# Patient Record
Sex: Female | Born: 1995 | Race: White | Hispanic: No | Marital: Single | Smoking: Never smoker
Health system: Southern US, Community
[De-identification: ages and names within clinical notes are randomized; demographics above are authoritative.]

---

## 2015-05-12 ENCOUNTER — Ambulatory Visit
Admission: EM | Admit: 2015-05-12 | Discharge: 2015-05-12 | Disposition: A | Discharge status: Home / Self Care | Payer: Managed Care, Other (non HMO) | Attending: Family Medicine | Admitting: Family Medicine

## 2015-05-12 ENCOUNTER — Encounter: Payer: Self-pay | Admitting: Emergency Medicine

## 2015-05-12 DIAGNOSIS — J019 Acute sinusitis, unspecified: Secondary | ICD-10-CM

## 2015-05-12 MED ORDER — FEXOFENADINE-PSEUDOEPHED ER 180-240 MG PO TB24: 1.0000 | ORAL_TABLET | Freq: Every day | ORAL | Status: AC

## 2015-05-12 MED ORDER — FLUTICASONE PROPIONATE 50 MCG/ACT NA SUSP: 2.0000 | Freq: Every day | NASAL | Status: AC

## 2015-05-12 MED ORDER — CEFUROXIME AXETIL 500 MG PO TABS: 500.0000 mg | ORAL_TABLET | Freq: Two times a day (BID) | ORAL | Status: DC

## 2015-05-12 NOTE — Discharge Instructions (Signed)

## 2015-05-12 NOTE — ED Provider Notes (Signed)
CSN: [id number]     Arrival date & time 05/12/15  [id number] History   First MD Initiated Contact with Patient 05/12/15 [phone]     Chief Complaint  Patient presents with  . Cough  . Nasal Congestion   (Consider location/radiation/quality/duration/timing/severity/associated sxs/prior Treatment) Patient is a 19 y.o. female presenting with cough. The history is provided by the patient and a parent. No language interpreter was used.  Cough Cough characteristics:  Productive Sputum characteristics:  Green and gray Severity:  Moderate Onset quality:  Sudden (Started about 11 days ago last Monday.) Progression:  Waxing and waning Chronicity:  New Context: upper respiratory infection   Context: not animal exposure, not exposure to allergens, not fumes, not occupational exposure, not sick contacts, not smoke exposure, not weather changes and not with activity   Relieved by:  Nothing Associated symptoms: fever, headaches, myalgias, rash, rhinorrhea and sinus congestion   Associated symptoms: no diaphoresis, no ear fullness, no ear pain, no shortness of breath, no sore throat, no weight loss and no wheezing     History reviewed. No pertinent past medical history. History reviewed. No pertinent past surgical history. History reviewed. No pertinent family history. History  Substance Use Topics  . Smoking status: Never Smoker   . Smokeless tobacco: Never Used  . Alcohol Use: No   OB History    No data available     Review of Systems  Constitutional: Positive for fever. Negative for weight loss and diaphoresis.  HENT: Positive for rhinorrhea. Negative for ear pain and sore throat.   Respiratory: Positive for cough. Negative for shortness of breath and wheezing.   Musculoskeletal: Positive for myalgias.  Skin: Positive for rash.  Neurological: Positive for headaches.    Allergies  Review of patient's allergies indicates no known allergies.  Home Medications   Prior to Admission medications    Medication Sig Start Date End Date Taking? Authorizing Provider  levonorgestrel (MIRENA) 20 MCG/24HR IUD 1 each by Intrauterine route once.   Yes Historical Provider, MD  cefUROXime (CEFTIN) 500 MG tablet Take 1 tablet (500 mg total) by mouth 2 (two) times daily. 05/12/15   Hassan RowanEugene Linah Klapper, MD  fexofenadine-pseudoephedrine (ALLEGRA-D ALLERGY & CONGESTION) 180-240 MG per 24 hr tablet Take 1 tablet by mouth daily. 05/12/15   Hassan RowanEugene Dominic Rhome, MD  fluticasone (FLONASE) 50 MCG/ACT nasal spray Place 2 sprays into both nostrils daily. 05/12/15   Hassan RowanEugene Gari Hartsell, MD   BP 132/85 mmHg  Pulse 84  Temp(Src) 97.4 F (36.3 C) (Tympanic)  Resp 16  Ht 5\' 3"  (1.6 m)  Wt 140 lb (63.504 kg)  BMI 24.81 kg/m2  SpO2 100%  LMP  Physical Exam  Constitutional: She is oriented to person, place, and time. She appears well-developed and well-nourished. No distress.  HENT:  Head: Normocephalic and atraumatic.  Nose: Mucosal edema and rhinorrhea present.  Mouth/Throat: She does not have dentures. No oral lesions. No dental abscesses or dental caries. Posterior oropharyngeal erythema present.  Eyes: Pupils are equal, round, and reactive to light. Right eye exhibits no discharge. Left eye exhibits no discharge. No scleral icterus.  Neck: Neck supple. No tracheal deviation present.  Cardiovascular: Normal rate and regular rhythm.  Exam reveals no friction rub.   No murmur heard. Pulmonary/Chest: Effort normal and breath sounds normal. No respiratory distress.  Musculoskeletal: Normal range of motion. She exhibits no edema.  Lymphadenopathy:    She has cervical adenopathy.  Neurological: She is oriented to person, place, and time.  Skin: Skin is dry.  ED Course  Procedures (including critical care time) Labs Review Labs Reviewed - No data to display Patient was treated with Ceftin Allegra-D and Flonase spray. Imaging Review No results found.   MDM   1. Acute sinusitis, recurrence not specified, unspecified location         Hassan RowanEugene Yehoshua Vitelli, MD 05/12/15 [id number]

## 2015-05-12 NOTE — ED Notes (Signed)
Patient c/o cough, chest congestion, runny nose, HAs, and low grade fever for a week.

## 2017-12-28 ENCOUNTER — Other Ambulatory Visit: Payer: Self-pay

## 2017-12-28 ENCOUNTER — Encounter: Payer: Self-pay | Admitting: Emergency Medicine

## 2017-12-28 ENCOUNTER — Emergency Department
Admission: EM | Admit: 2017-12-28 | Discharge: 2017-12-28 | Disposition: A | Discharge status: Home / Self Care | Payer: 59 | Attending: Emergency Medicine | Admitting: Emergency Medicine

## 2017-12-28 ENCOUNTER — Emergency Department: Payer: 59

## 2017-12-28 DIAGNOSIS — Z79899 Other long term (current) drug therapy: Secondary | ICD-10-CM | POA: Insufficient documentation

## 2017-12-28 DIAGNOSIS — R000 Tachycardia, unspecified: Secondary | ICD-10-CM | POA: Insufficient documentation

## 2017-12-28 DIAGNOSIS — E039 Hypothyroidism, unspecified: Secondary | ICD-10-CM

## 2017-12-28 LAB — COMPREHENSIVE METABOLIC PANEL
ALBUMIN: 4.9 g/dL (ref 3.5–5.0)
ALT: 16 U/L (ref 14–54)
AST: 31 U/L (ref 15–41)
Alkaline Phosphatase: 66 U/L (ref 38–126)
Anion gap: 9 (ref 5–15)
BUN: 15 mg/dL (ref 6–20)
CHLORIDE: 105 mmol/L (ref 101–111)
CO2: 26 mmol/L (ref 22–32)
CREATININE: 0.87 mg/dL (ref 0.44–1.00)
Calcium: 9.5 mg/dL (ref 8.9–10.3)
GFR calc Af Amer: >60 mL/min (ref >60)
GFR calc non Af Amer: >60 mL/min (ref >60)
Glucose, Bld: 102 mg/dL — ABNORMAL HIGH (ref 65–99)
Potassium: 4 mmol/L (ref 3.5–5.1)
SODIUM: 140 mmol/L (ref 135–145)
Total Bilirubin: 0.7 mg/dL (ref 0.3–1.2)
Total Protein: 8.6 g/dL — ABNORMAL HIGH (ref 6.5–8.1)

## 2017-12-28 LAB — CBC WITH DIFFERENTIAL/PLATELET
Basophils Absolute: 0 10*3/uL (ref 0–0.1)
Basophils Relative: 1 %
EOS ABS: 0.1 10*3/uL (ref 0–0.7)
EOS PCT: 1 %
HCT: 41.3 % (ref 35.0–47.0)
Hemoglobin: 14.2 g/dL (ref 12.0–16.0)
LYMPHS ABS: 0.9 10*3/uL — AB (ref 1.0–3.6)
LYMPHS PCT: 14 %
MCH: 30.1 pg (ref 26.0–34.0)
MCHC: 34.3 g/dL (ref 32.0–36.0)
MCV: 87.6 fL (ref 80.0–100.0)
MONOS PCT: 8 %
Monocytes Absolute: 0.5 10*3/uL (ref 0.2–0.9)
Neutro Abs: 4.9 10*3/uL (ref 1.4–6.5)
Neutrophils Relative %: 76 %
Platelets: 288 10*3/uL (ref 150–440)
RBC: 4.71 MIL/uL (ref 3.80–5.20)
RDW: 13.3 % (ref 11.5–14.5)
WBC: 6.4 10*3/uL (ref 3.6–11.0)

## 2017-12-28 LAB — URINALYSIS, COMPLETE (UACMP) WITH MICROSCOPIC
BACTERIA UA: NONE SEEN
Bilirubin Urine: NEGATIVE
Glucose, UA: NEGATIVE mg/dL
Hgb urine dipstick: NEGATIVE
Ketones, ur: NEGATIVE mg/dL
Nitrite: NEGATIVE
PH: 5 (ref 5.0–8.0)
PROTEIN: NEGATIVE mg/dL
Specific Gravity, Urine: 1.006 (ref 1.005–1.030)

## 2017-12-28 LAB — URINE DRUG SCREEN, QUALITATIVE (ARMC ONLY)
AMPHETAMINES, UR SCREEN: NOT DETECTED
BARBITURATES, UR SCREEN: NOT DETECTED
Benzodiazepine, Ur Scrn: NOT DETECTED
Cannabinoid 50 Ng, Ur ~~LOC~~: NOT DETECTED
Cocaine Metabolite,Ur ~~LOC~~: NOT DETECTED
MDMA (Ecstasy)Ur Screen: NOT DETECTED
METHADONE SCREEN, URINE: NOT DETECTED
OPIATE, UR SCREEN: NOT DETECTED
Phencyclidine (PCP) Ur S: NOT DETECTED
TRICYCLIC, UR SCREEN: POSITIVE — AB

## 2017-12-28 LAB — TSH: TSH: 16.677 u[IU]/mL — AB (ref 0.350–4.500)

## 2017-12-28 LAB — HCG, QUANTITATIVE, PREGNANCY

## 2017-12-28 LAB — POCT PREGNANCY, URINE: PREG TEST UR: NEGATIVE

## 2017-12-28 LAB — ETHANOL

## 2017-12-28 MED ORDER — METOPROLOL TARTRATE 50 MG PO TABS: 50.0000 mg | ORAL_TABLET | Freq: Two times a day (BID) | ORAL | 0 refills | Status: AC

## 2017-12-28 MED ORDER — SODIUM CHLORIDE 0.9 % IV BOLUS (SEPSIS)
1000.0000 mL | Freq: Once | INTRAVENOUS | Status: AC
  Administered 2017-12-28: 1000 mL via INTRAVENOUS

## 2017-12-28 MED ORDER — METOPROLOL TARTRATE 50 MG PO TABS
50.0000 mg | ORAL_TABLET | Freq: Once | ORAL | Status: AC
  Administered 2017-12-28: 50 mg via ORAL
  Filled 2017-12-28: qty 1

## 2017-12-28 NOTE — ED Provider Notes (Addendum)
[facility] Emergency Department Provider Note  ____________________________________________   First MD Initiated Contact with Patient 12/28/17 [phone]     (approximate)  I have reviewed the triage vital signs and the nursing notes.   HISTORY  Chief Complaint Tachycardia   HPI Karen Woodard is a 22 y.o. female with a history of headache ongoing over the past 2 months was presenting to the emergency department today with palpitations.  Says that the palpitations worker at 5:45 AM.  Says that she feels that her heart is fluttering but denies any chest pain, shortness of breath or nausea.  She does not report that she is feeling like she is going to pass out.  She says that she is started nortriptyline as of late December and is increased her dose to 20 mg.  Nortriptyline has not helped the headaches.  She was supposed to start a Medrol Dosepak today but has not started yet.  She says that she has had a CAT scan which came back without any acute disease this past October when the headache started.  She is discussing a possible MRI with her neurologist, Dr. Malvin Johns, at the [facility].  No family history of cardiac arrhythmia.  Mother has a history of headaches and also a cerebral venous sinus thrombosis that she developed after giving birth.  Patient is denying headache at this time.  Patient said that she had one beer last night but does not drink heavily.  Denies any drug use.   History reviewed. No pertinent past medical history.  There are no active problems to display for this patient.   History reviewed. No pertinent surgical history.  Prior to Admission medications   Medication Sig Start Date End Date Taking? Authorizing Provider  cefUROXime (CEFTIN) 500 MG tablet Take 1 tablet (500 mg total) by mouth 2 (two) times daily. 05/12/15   Hassan Rowan, MD  fexofenadine-pseudoephedrine (ALLEGRA-D ALLERGY & CONGESTION) 180-240 MG per 24 hr tablet Take 1 tablet by  mouth daily. 05/12/15   Hassan Rowan, MD  fluticasone (FLONASE) 50 MCG/ACT nasal spray Place 2 sprays into both nostrils daily. 05/12/15   Hassan Rowan, MD  levonorgestrel (MIRENA) 20 MCG/24HR IUD 1 each by Intrauterine route once.    [provider]    Allergies Patient has no known allergies.  No family history on file.  Social History Social History   Tobacco Use  . Smoking status: Never Smoker  . Smokeless tobacco: Never Used  Substance Use Topics  . Alcohol use: Yes    Comment: occ  . Drug use: No    Review of Systems  Constitutional: No fever/chills Eyes: No visual changes. ENT: No sore throat. Cardiovascular: As above Respiratory: Denies shortness of breath. Gastrointestinal: No abdominal pain.  No nausea, no vomiting.  No diarrhea.  No constipation. Genitourinary: Negative for dysuria. Musculoskeletal: Negative for back pain. Skin: Negative for rash. Neurological: Negative for focal weakness or numbness.   ____________________________________________   PHYSICAL EXAM:  VITAL SIGNS: ED Triage Vitals  Enc Vitals Group     BP 12/28/17 0636 (!) 150/85     Pulse Rate 12/28/17 0636 (!) 148     Resp 12/28/17 0636 18     Temp 12/28/17 0636 98.4 F (36.9 C)     Temp Source 12/28/17 0636 Oral     SpO2 12/28/17 0636 100 %     Weight 12/28/17 0633 148 lb (67.1 kg)     Height 12/28/17 0633 5\' 4"  (1.626 m)  Head Circumference --      Peak Flow --      Pain Score --      Pain Loc --      Pain Edu? --      Excl. in GC? --     Constitutional: Alert and oriented. Well appearing and in no acute distress. Eyes: Conjunctivae are normal.  Head: Atraumatic. Nose: No congestion/rhinnorhea. Mouth/Throat: Mucous membranes are moist.  Neck: No stridor.   Cardiovascular: Tachycardic, regular rhythm. Grossly normal heart sounds.   Respiratory: Normal respiratory effort.  No retractions. Lungs CTAB. Gastrointestinal: Soft and nontender. No distention. No CVA  tenderness. Musculoskeletal: No lower extremity tenderness nor edema.  No joint effusions. Neurologic:  Normal speech and language. No gross focal neurologic deficits are appreciated. Skin:  Skin is warm, dry and intact. No rash noted. Psychiatric: Mood and affect are normal. Speech and behavior are normal.  ____________________________________________   LABS (all labs ordered are listed, but only abnormal results are displayed)  Labs Reviewed  CBC WITH DIFFERENTIAL/PLATELET - Abnormal; Notable for the following components:      Result Value   Lymphs Abs 0.9 (*)    All other components within normal limits  URINALYSIS, COMPLETE (UACMP) WITH MICROSCOPIC - Abnormal; Notable for the following components:   Color, Urine STRAW (*)    APPearance CLEAR (*)    Leukocytes, UA MODERATE (*)    Squamous Epithelial / LPF 0-5 (*)    All other components within normal limits  URINE DRUG SCREEN, QUALITATIVE ([facility] ONLY) - Abnormal; Notable for the following components:   Tricyclic, Ur Screen POSITIVE (*)    All other components within normal limits  ETHANOL  COMPREHENSIVE METABOLIC PANEL  TSH  HCG, QUANTITATIVE, PREGNANCY  POCT PREGNANCY, URINE   ____________________________________________  EKG  ED ECG REPORT I, Arelia Longestavid M Schaevitz, the attending physician, personally viewed and interpreted this ECG.   Date: 12/28/2017  EKG Time: 0633  Rate: 148  Rhythm: sinus tachycardia  Axis: Normal  Intervals:none  ST&T Change: No ST segment elevation or depression.  No abnormal T wave inversion.  ____________________________________________  RADIOLOGY  Chest x-ray without active disease ____________________________________________   PROCEDURES  Procedure(s) performed:   Procedures  Critical Care performed:   ____________________________________________   INITIAL IMPRESSION / ASSESSMENT AND PLAN / ED COURSE  Pertinent labs & imaging results that were available during my care of  the patient were reviewed by me and considered in my medical decision making (see chart for details).  DDX: Nortriptyline side effect, atrial flutter, sinus tachycardia, hyperthyroidism As part of my medical decision making, I reviewed the following data within the electronic MEDICAL RECORD NUMBER Old chart reviewed  ----------------------------------------- 7:53 AM on 12/28/2017 -----------------------------------------  I discussed the case with Dr. Darrold JunkerParaschos who believes that this is likely a sinus tachycardia.  Recommends weaning the patient off the nortriptyline over the course of 1 week as well as starting twice daily metoprolol at 50 mg.  Recommend that the patient follow-up in the office within 1 week.  Consider the diagnosis of pulmonary embolus but there is no chest pain or shortness of breath.  Less likely to be PE.   ----------------------------------------- 8:54 AM on 12/28/2017 -----------------------------------------  Patient's heart rate of 103 at this time.  Still with blood pressure in the 140s.  I reviewed her blood pressures over the past several months and they appear to be in the 120s-130s.  We will proceed with 50 mg of twice daily metoprolol as recommended  by Dr. Darrold Junker.  Explained the plan to the patient as well as family including weaning off the nortriptyline over the next week.  I told the patient that she may use her Medrol Dosepak as needed but to stop it if she experiences side effects from the steroids.  She also knows that she was follow-up with cardiology in the office, to stay hydrated and also to reduce any caffeine intake. Patient with elevated TSH but this would most likely cause a low heart rate or fatigue.  Unclear clinical significance as this is contributing to the patient's current medical state.  However, we reviewed the lab results and she will be following with her primary care doctor.   ____________________________________________   FINAL CLINICAL  IMPRESSION(S) / ED DIAGNOSES  Sinus tachycardia.    NEW MEDICATIONS STARTED DURING THIS VISIT:  This SmartLink is deprecated. Use AVSMEDLIST instead to display the medication list for a patient.   Note:  This document was prepared using Dragon voice recognition software and may include unintentional dictation errors.     Myrna Blazer, MD 12/28/17 [phone]    Myrna Blazer, MD 12/28/17 [phone]

## 2017-12-28 NOTE — ED Notes (Signed)

## 2017-12-28 NOTE — ED Notes (Addendum)
Pt reports she was awakened around 545 am with th feeling of her heart racing. Pt denies chest pain, shortness of breath or other sx. Pt denies hx of same.

## 2017-12-28 NOTE — ED Triage Notes (Signed)
Heart racing since 5:45 am

## 2017-12-31 ENCOUNTER — Other Ambulatory Visit: Payer: Self-pay | Admitting: Neurology

## 2017-12-31 DIAGNOSIS — R519 Headache, unspecified: Secondary | ICD-10-CM

## 2017-12-31 DIAGNOSIS — R51 Headache: Principal | ICD-10-CM

## 2018-01-07 ENCOUNTER — Ambulatory Visit: Payer: 59

## 2018-01-08 ENCOUNTER — Ambulatory Visit
Admission: RE | Admit: 2018-01-08 | Discharge: 2018-01-08 | Disposition: A | Discharge status: Home / Self Care | Payer: 59 | Source: Ambulatory Visit | Attending: Neurology | Admitting: Neurology

## 2018-01-08 DIAGNOSIS — R519 Headache, unspecified: Secondary | ICD-10-CM

## 2018-01-08 DIAGNOSIS — R51 Headache: Secondary | ICD-10-CM | POA: Insufficient documentation

## 2018-01-08 DIAGNOSIS — R42 Dizziness and giddiness: Secondary | ICD-10-CM | POA: Diagnosis present

## 2018-04-30 IMAGING — MR MR HEAD W/O CM
8 series · 48 of 48 positions shown · non-contrast
Comparison: None.

CLINICAL DATA: Chronic daily headache

EXAM:
MRI HEAD WITHOUT CONTRAST
TECHNIQUE: Multiplanar, multiecho pulse sequences of the brain and surrounding
structures were obtained without intravenous contrast.

[Series 2: T1 · sagittal · 5.0mm · 0.45mm/px · 3 of 23 slices shown (1 of 2)]
[im 1/23]
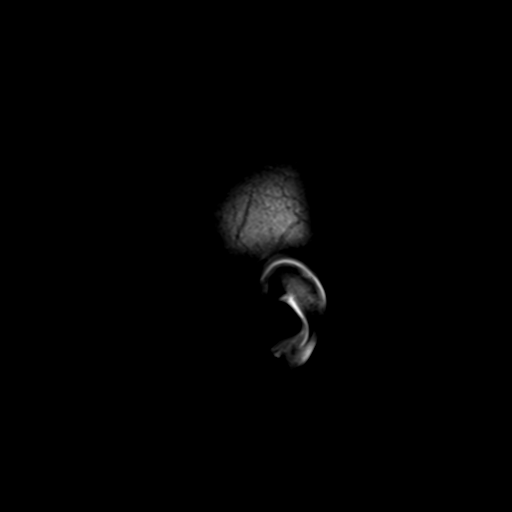
[im 12/23]
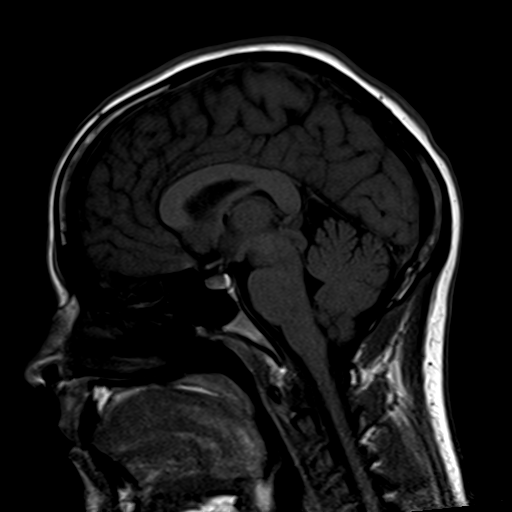
[im 23/23]
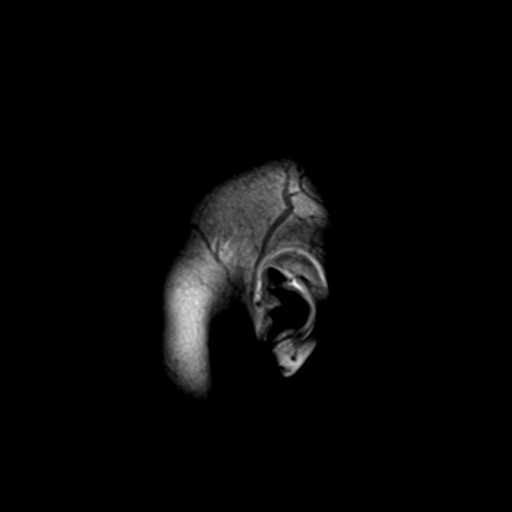

[Series 4: DWI · axial · 3.0mm · 1.20mm/px · z∈[-57,+104]mm · 6 of 55 slices shown (1 of 2)]
[im 1/55]
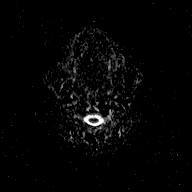
[im 11/55]
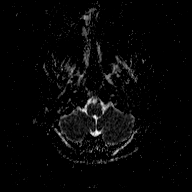
[im 22/55]
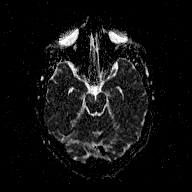
[im 33/55]
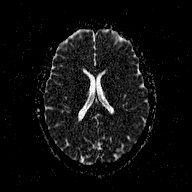
[im 44/55]
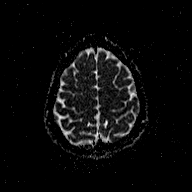
[im 55/55]
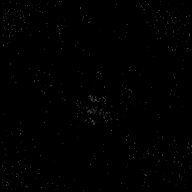

[Series 6: FLAIR · axial · 3.0mm · 0.45mm/px · z∈[-58,+103]mm · 6 of 55 slices shown]
[im 1/55]
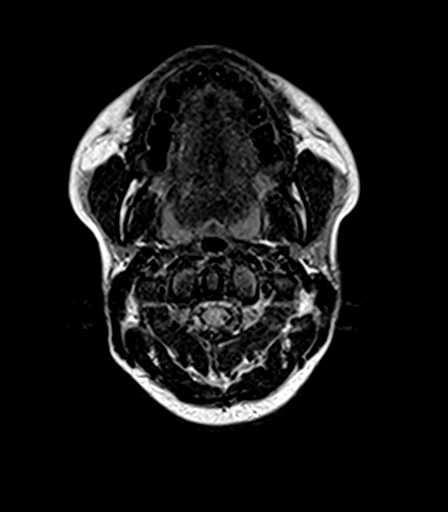
[im 11/55]
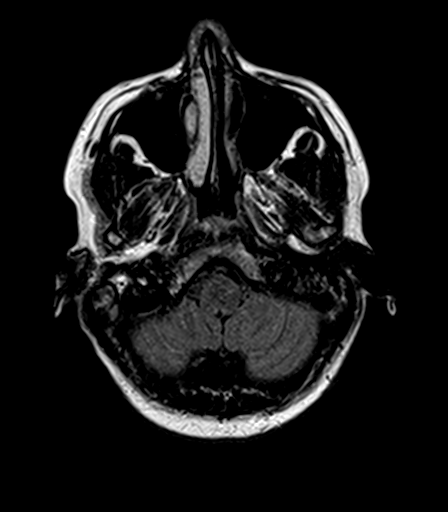
[im 22/55]
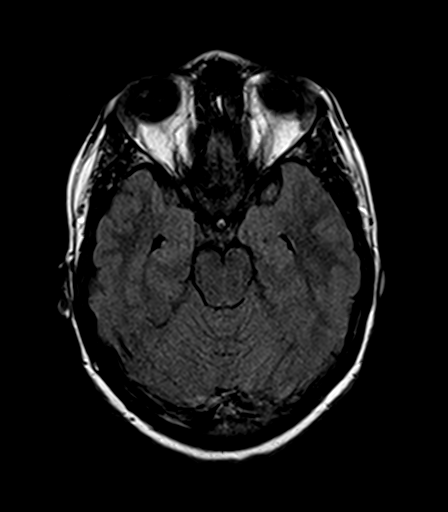
[im 33/55]
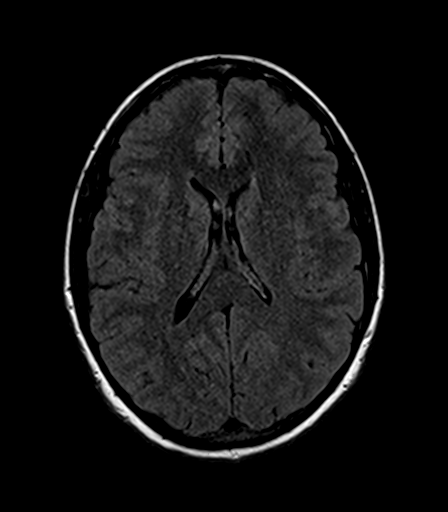
[im 44/55]
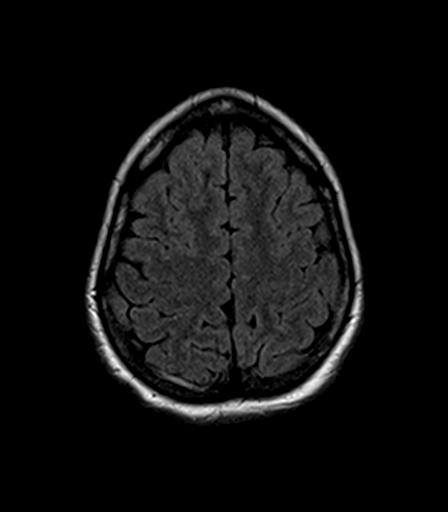
[im 55/55]
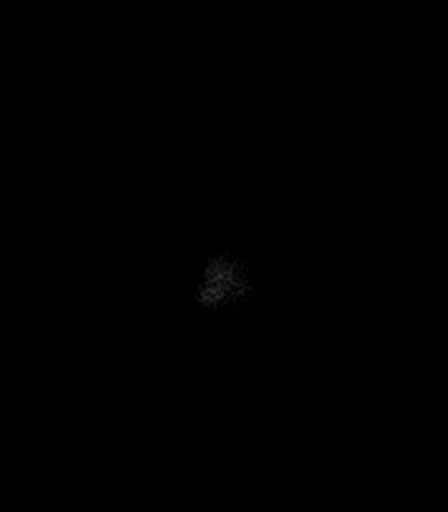

[Series 7: T2 · axial · 5.0mm · 0.72mm/px · z∈[-54,+99]mm · 3 of 23 slices shown (1 of 3)]
[im 1/23]
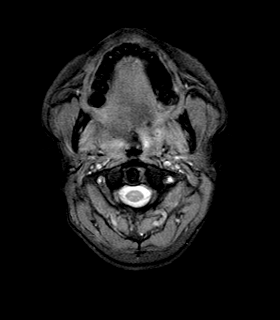
[im 12/23]
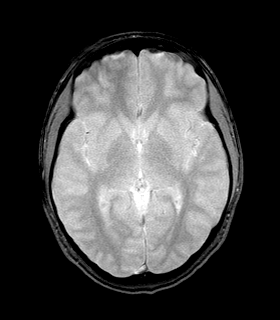
[im 23/23]
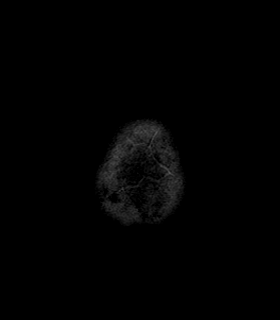

[Series 8: T1 · axial · 1.0mm · 1.00mm/px · z∈[-58,+100]mm · 18 of 160 slices shown (2 of 2)]
[im 1/160]
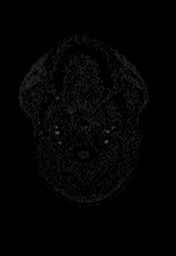
[im 10/160]
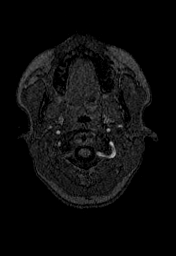
[im 19/160]
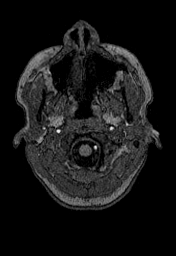
[im 29/160]
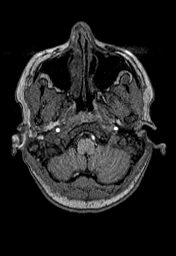
[im 38/160]
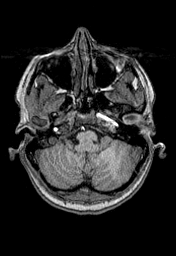
[im 47/160]
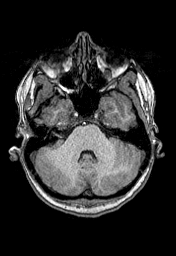
[im 57/160]
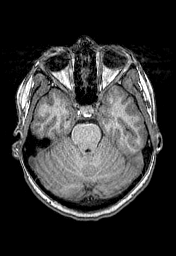
[im 66/160]
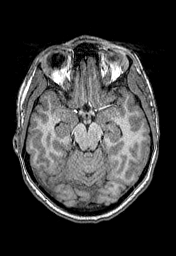
[im 75/160]
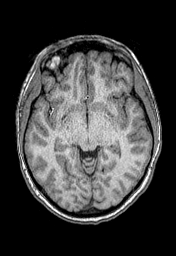
[im 85/160]
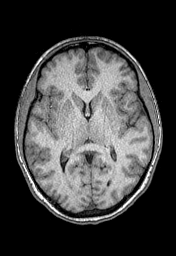
[im 94/160]
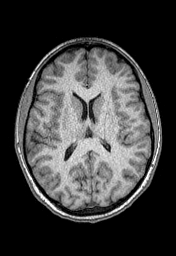
[im 103/160]
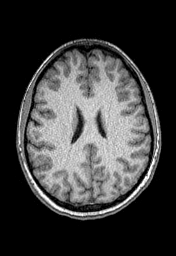
[im 113/160]
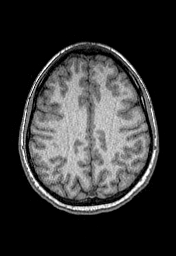
[im 122/160]
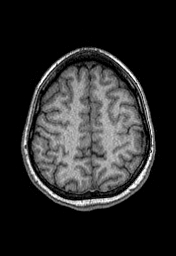
[im 131/160]
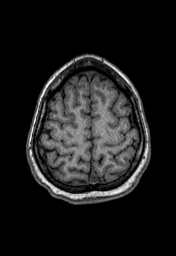
[im 141/160]
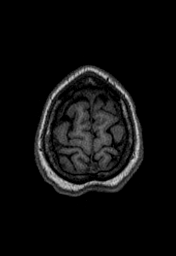
[im 150/160]
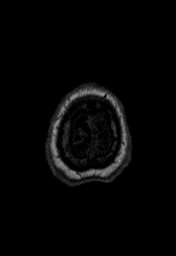
[im 160/160]
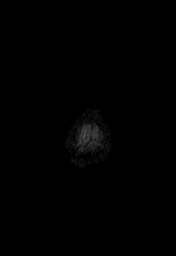

[Series 9: T2 · coronal · 5.0mm · 0.45mm/px · 3 of 29 slices shown (2 of 3)]
[im 1/29]
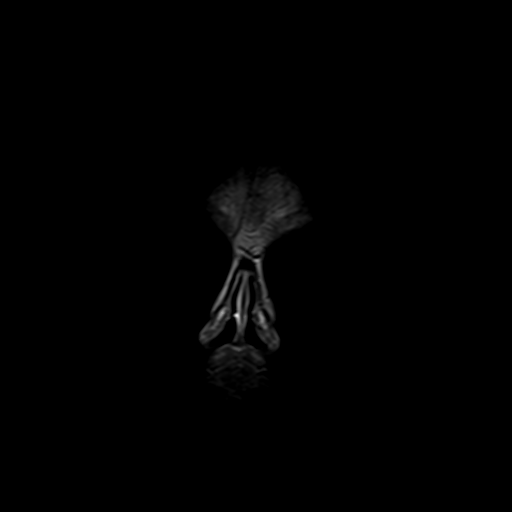
[im 15/29]
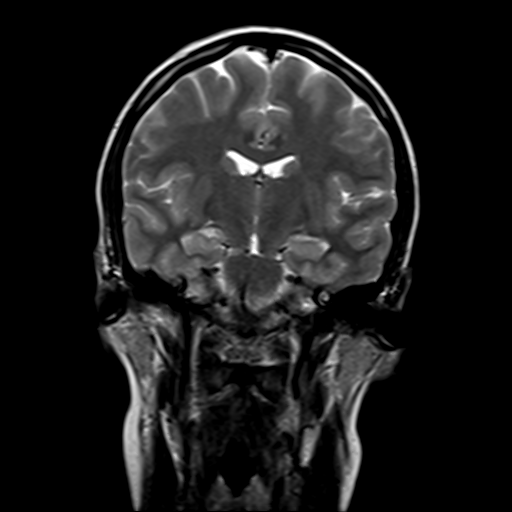
[im 29/29]
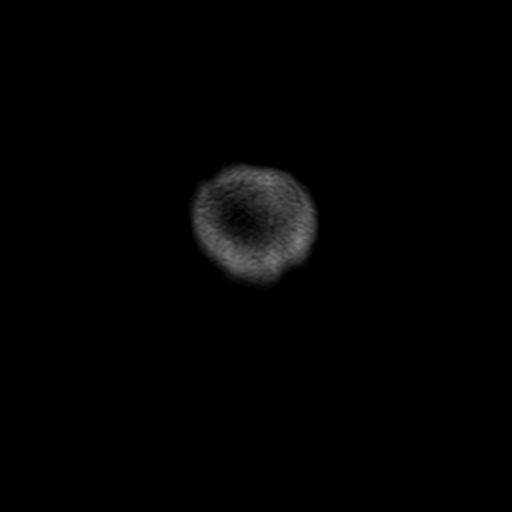

[Series 10: T2 · axial · 5.0mm · 0.72mm/px · z∈[-54,+99]mm · 3 of 23 slices shown (3 of 3)]
[im 1/23]
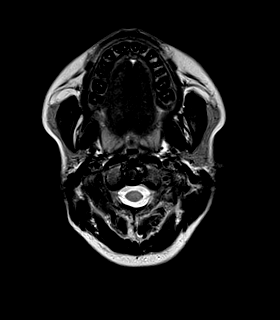
[im 12/23]
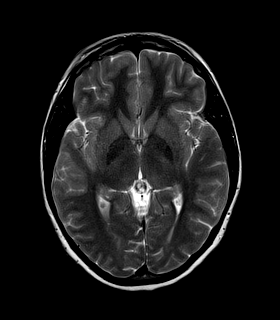
[im 23/23]
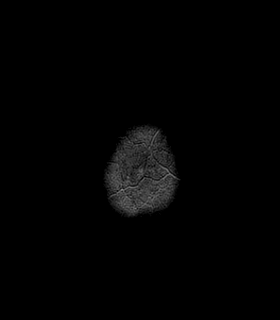

[Series 100: DWI · axial · 3.0mm · 1.20mm/px · z∈[-57,+104]mm · 6 of 55 slices shown (2 of 2)]
[im 1/55]
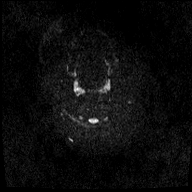
[im 11/55]
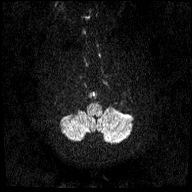
[im 22/55]
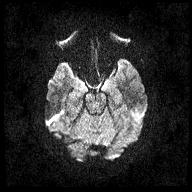
[im 33/55]
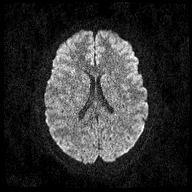
[im 44/55]
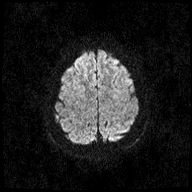
[im 55/55]
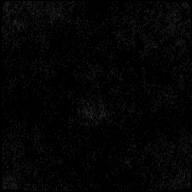

[48 of 48 positions shown; findings below may reference images not displayed]

FINDINGS: Brain: No acute infarction, hemorrhage, hydrocephalus, extra-axial
collection or mass lesion.

Vascular: Normal arterial flow voids

Skull and upper cervical spine: Negative

Sinuses/Orbits: Negative

Other: None
IMPRESSION: Negative MRI head
